# Patient Record
Sex: Female | Born: 1975 | Race: White | Hispanic: No | Marital: Married
Health system: Southern US, Community
[De-identification: ages and names within clinical notes are randomized; demographics above are authoritative.]

---

## 1998-10-16 ENCOUNTER — Ambulatory Visit (HOSPITAL_COMMUNITY): Admission: RE | Admit: 1998-10-16 | Discharge: 1998-10-16 | Payer: Self-pay

## 2007-09-10 ENCOUNTER — Other Ambulatory Visit: Payer: Self-pay

## 2007-09-10 ENCOUNTER — Emergency Department: Payer: Self-pay | Admitting: Internal Medicine

## 2007-10-14 ENCOUNTER — Ambulatory Visit: Payer: Self-pay | Admitting: Cardiology

## 2010-04-23 ENCOUNTER — Encounter: Payer: Self-pay | Admitting: Obstetrics and Gynecology

## 2010-05-29 ENCOUNTER — Observation Stay: Payer: Self-pay | Admitting: Obstetrics and Gynecology

## 2010-06-22 ENCOUNTER — Observation Stay: Payer: Self-pay | Admitting: Obstetrics and Gynecology

## 2010-06-29 ENCOUNTER — Observation Stay: Payer: Self-pay

## 2010-07-06 ENCOUNTER — Observation Stay: Payer: Self-pay

## 2010-07-13 ENCOUNTER — Observation Stay: Payer: Self-pay | Admitting: Obstetrics and Gynecology

## 2010-07-20 ENCOUNTER — Observation Stay: Payer: Self-pay | Admitting: Obstetrics and Gynecology

## 2010-07-27 ENCOUNTER — Observation Stay: Payer: Self-pay

## 2010-07-30 ENCOUNTER — Observation Stay: Payer: Self-pay | Admitting: Obstetrics and Gynecology

## 2010-08-08 ENCOUNTER — Observation Stay: Payer: Self-pay | Admitting: Obstetrics and Gynecology

## 2010-08-09 ENCOUNTER — Inpatient Hospital Stay: Payer: Self-pay | Admitting: Obstetrics and Gynecology

## 2010-11-26 ENCOUNTER — Ambulatory Visit: Payer: Self-pay | Admitting: Gastroenterology

## 2011-03-05 ENCOUNTER — Ambulatory Visit: Payer: Self-pay | Admitting: Anesthesiology

## 2011-03-05 LAB — POTASSIUM: Potassium: 4.4 mmol/L (ref 3.5–5.1)

## 2011-03-06 ENCOUNTER — Ambulatory Visit: Payer: Self-pay | Admitting: Surgery

## 2011-03-07 LAB — PATHOLOGY REPORT

## 2011-05-07 ENCOUNTER — Ambulatory Visit: Payer: Self-pay | Admitting: Obstetrics and Gynecology

## 2012-08-09 IMAGING — US ABDOMEN ULTRASOUND LIMITED
1 series · 17 of 25 positions shown · non-contrast
Comparison: none

REASON FOR EXAM: RUQ LUQ epigastric nausea
COMMENTS:

[Series 1: abdomen ultrasound limited · 17 of 93 slices shown]
[im 1/93]
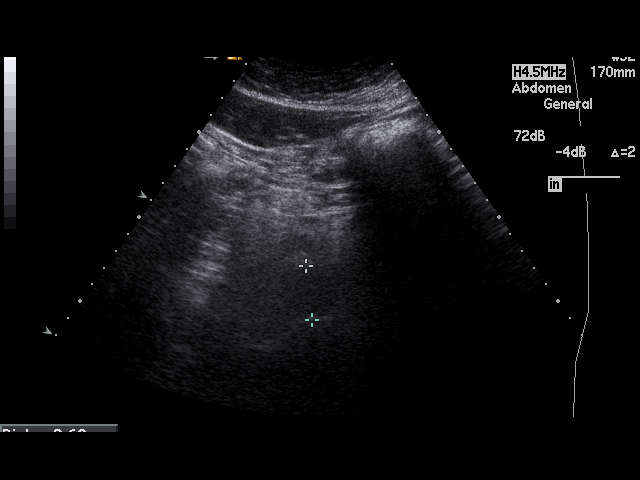
[im 8/93]
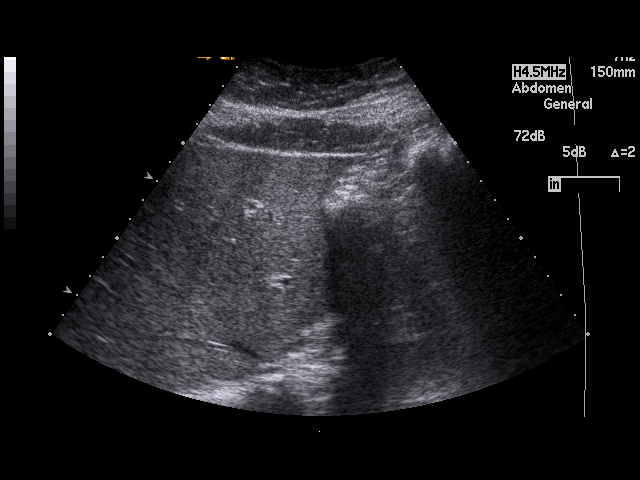
[im 12/93]
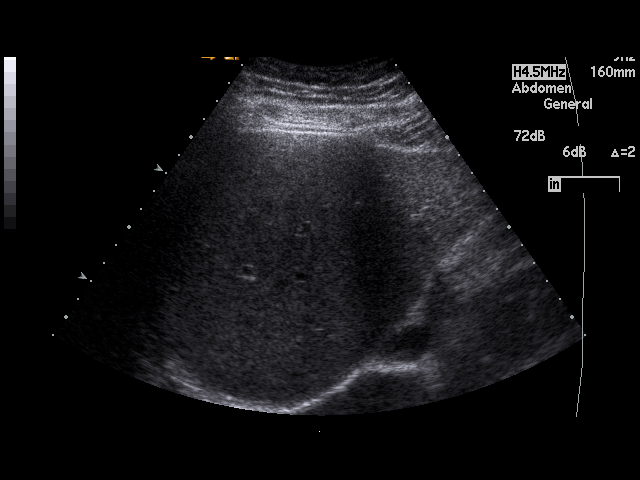
[im 20/93]
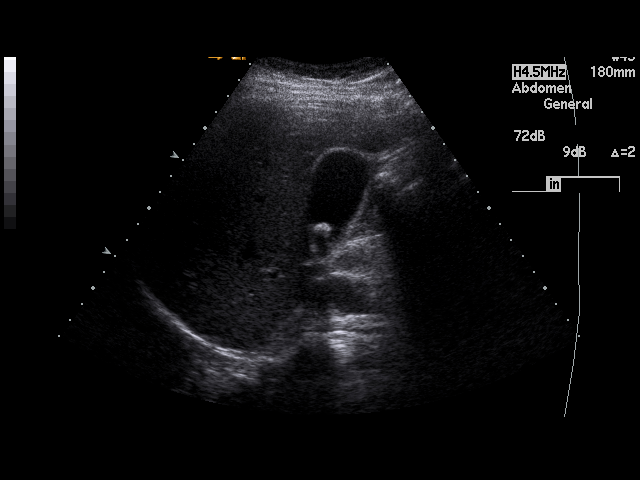
[im 24/93]
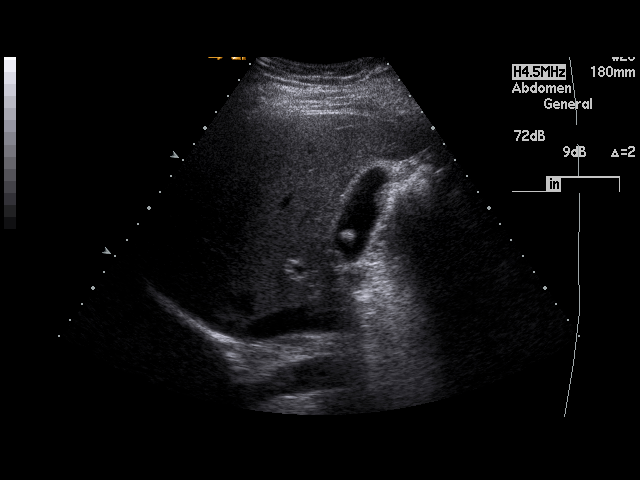
[im 31/93]
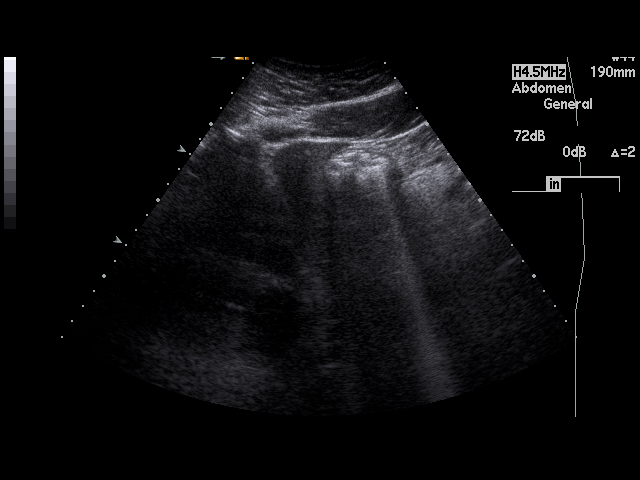
[im 35/93]
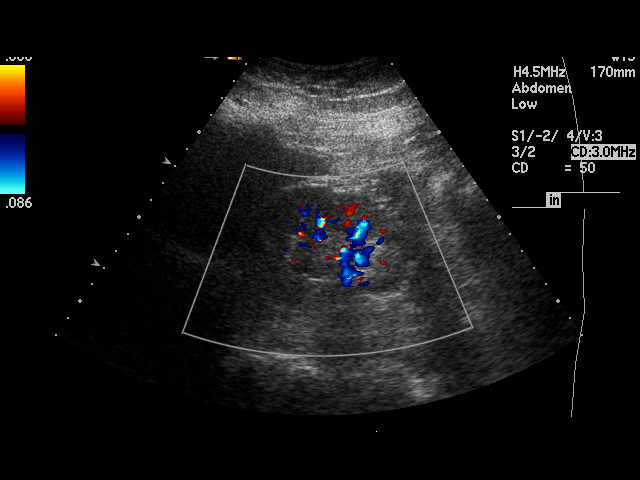
[im 43/93]
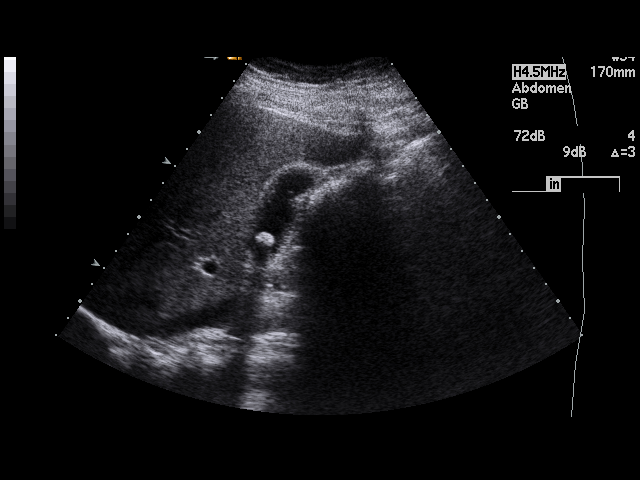
[im 47/93]
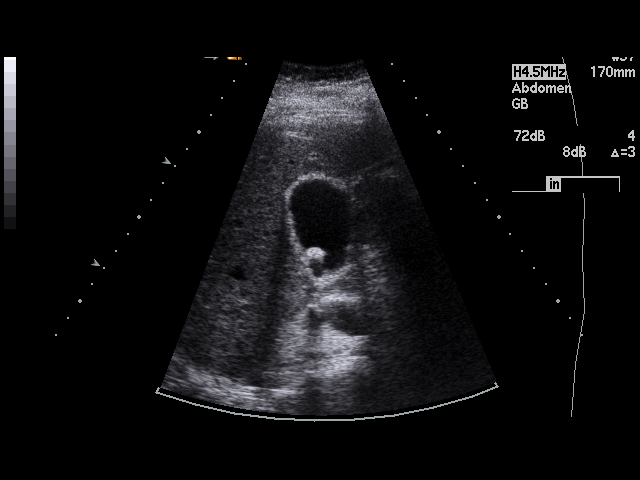
[im 50/93]
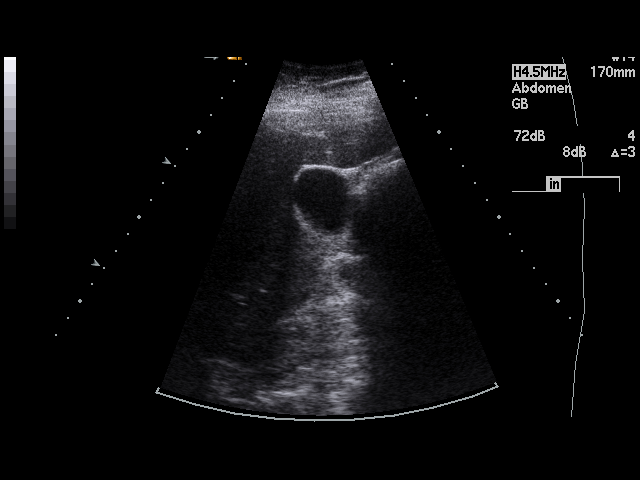
[im 58/93]
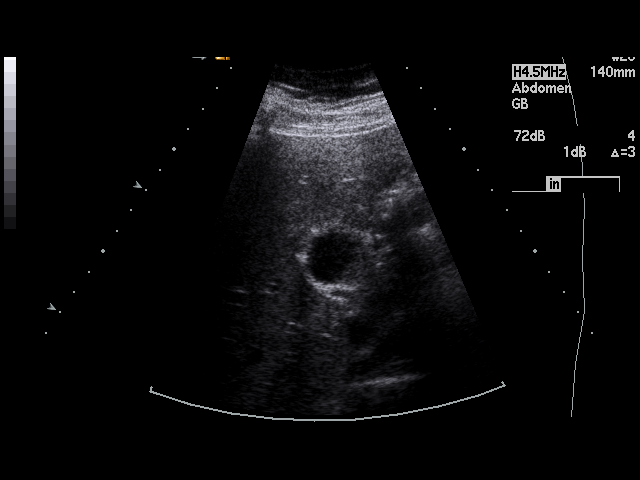
[im 62/93]
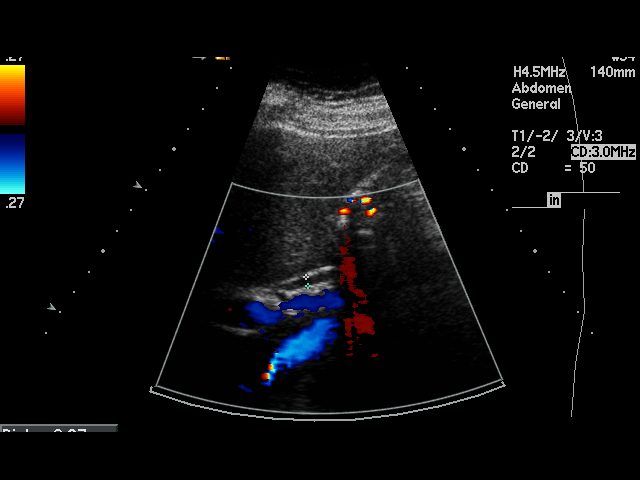
[im 70/93]
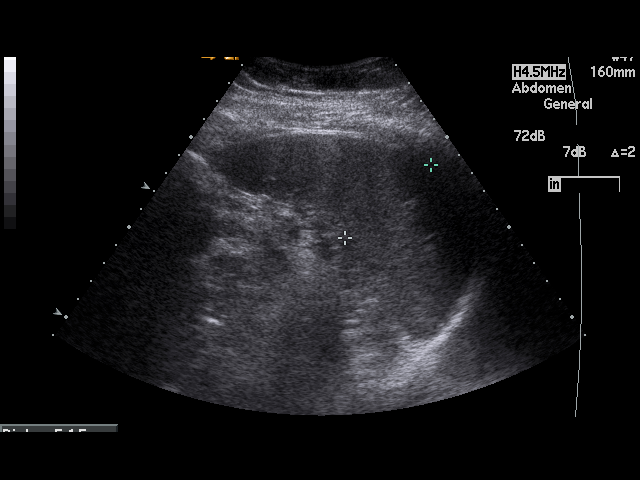
[im 73/93]
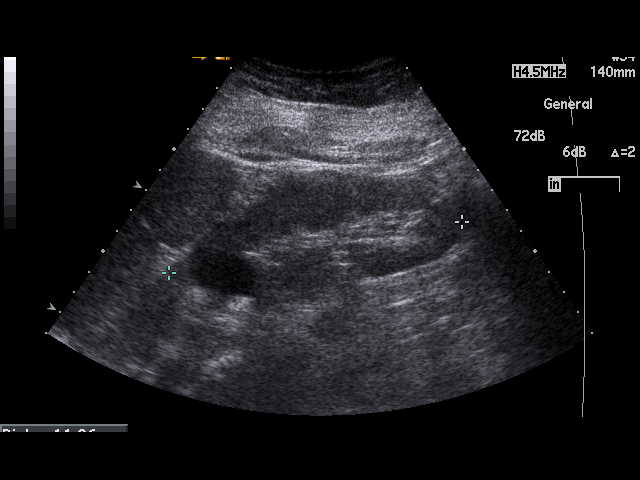
[im 81/93]
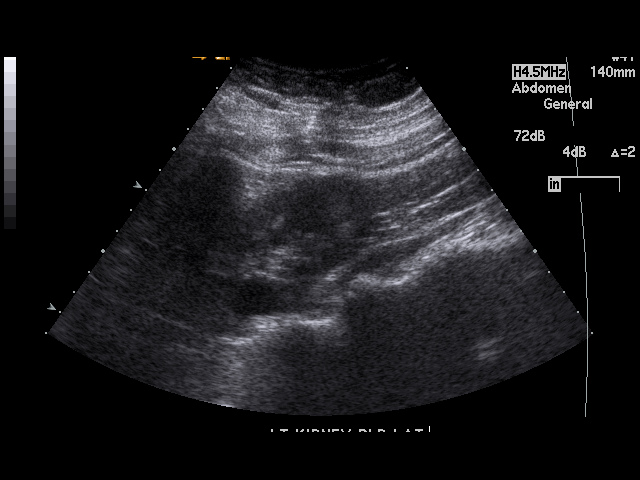
[im 85/93]
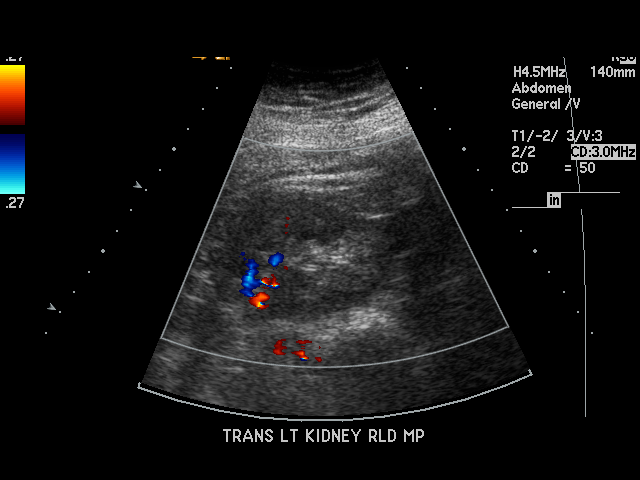
[im 93/93]
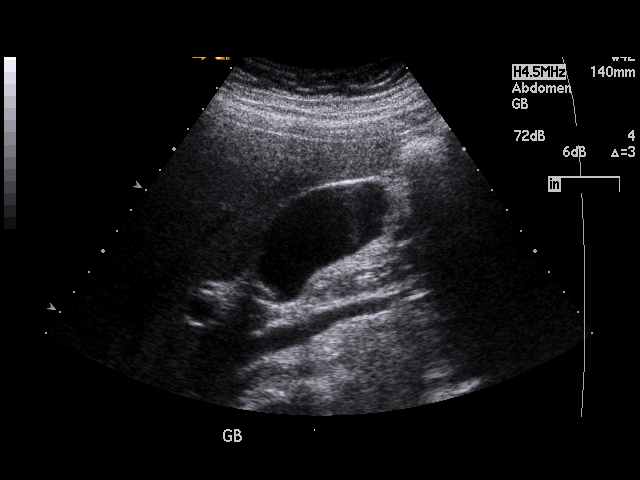

[17 of 25 positions shown; findings below may reference images not displayed]

PROCEDURE:     DRAPEL - DRAPEL ABDOMEN UPPER GENERAL  - November 26, 2010 [DATE]

RESULT:     The liver, spleen, abdominal aorta and inferior vena cava show
no significant abnormalities. The body of the pancreas is normal in
appearance. The head and tail are partially obscured by bowel gas and are
not visualized adequately for evaluation. There is observed a mobile
gallstone in the gallbladder. There is no thickening of the gallbladder
wall. The common bile duct measures 4.3 mm in diameter which is within
normal limits. The kidneys show no hydronephrosis. There is a 1.77 cm cyst
to the upper pole of the left kidney. Sagittally, the right kidney measures
12.29 cm and the left measures 12.64 cm. No ascites is seen.
IMPRESSION: 1. Cholelithiasis. There is no associated thickening of the gallbladder wall
or pericholecystic fluid.
2. A left renal cyst is noted as mentioned above.
3. The pancreas is partially obscured by bowel gas.

## 2014-06-19 NOTE — Op Note (Signed)
PATIENT NAME:  Christina Lester, Christina Lester MR#:  [id number] DATE OF BIRTH:  1975/11/01  DATE OF PROCEDURE:  03/06/2011  PREOPERATIVE DIAGNOSIS: Chronic cholecystitis, cholelithiasis.   POSTOPERATIVE DIAGNOSIS: Chronic cholecystitis, cholelithiasis.   PROCEDURE: Laparoscopic cholecystectomy.   SURGEON: Renda RollsWilton Smith, MD  ASSISTANT: Carmell AustriaAnn Collins, PA  ANESTHESIA: General.   INDICATIONS: This 39 year old female has a history of right upper quadrant abdominal pain and ultrasound findings of a solitary gallstone and surgery was recommended for definitive treatment.   DESCRIPTION OF PROCEDURE: The patient was placed on the operating room table in the supine position under general endotracheal anesthesia. The abdomen was prepared with ChloraPrep and draped in a sterile manner.   A short incision was made in the inferior aspect of the umbilicus and carried down to the deep fascia which was grasped with laryngeal hook and elevated. A Veress needle was inserted, aspirated, and irrigated with a saline solution. Next, the peritoneal cavity was inflated with carbon dioxide. The Veress needle was removed. The 10 mm cannula was inserted. The 10 mm, 0 degree laparoscope was inserted to view the peritoneal cavity. Another incision was made in the epigastrium just slightly to the right of the midline to introduce an 11 mm cannula. Two incisions were made in the lateral aspect of the right upper quadrant to introduce two 5 mm cannulas.   Initial inspection revealed the liver had a normal appearance. The gallbladder had a slightly thickened wall and phrygian cap. The gallbladder was retracted towards the right shoulder. The infundibulum was retracted inferiorly and laterally. The porta hepatis was demonstrated. The cystic duct was dissected free from surrounding structures. The neck of the gallbladder was mobilized with incision of the visceral peritoneum, the cystic artery was dissected free from surrounding structures, and the  porta hepatis was further demonstrated. A critical view of safety was demonstrated. An endoclip was placed across the cystic duct adjacent to the neck of the gallbladder. An incision was made in the cystic duct. An effort was made to insert a Reddick catheter, however, it would not thread. Therefore, a cholangiogram was not done. The Reddick catheter was removed. The cystic duct was doubly ligated with endoclips and divided. The cystic artery was controlled with double endoclips and divided. The gallbladder was dissected free from the liver with hook and cautery. There was some drainage of a thin bile which was aspirated. The gallbladder was completely separated. The site was irrigated with heparinized saline solution and aspirated. Hemostasis was subsequently intact. The gallbladder was delivered up through the infraumbilical incision, opened and suctioned. A stone was crushed and removed. The gallbladder was removed and sent with the stone in formalin for routine pathology. The right upper quadrant was further inspected, irrigated, and aspirated. Hemostasis was intact. The cannulas were removed allowing carbon dioxide to escape from the peritoneal cavity. Skin incisions were closed with interrupted 5-0 chromic subcuticular sutures, benzoin, and Steri-Strips. Dressings were applied with paper tape. The patient is now being prepared for transfer to the Recovery Room.  ____________________________ Shela CommonsJ. Renda RollsWilton Smith, MD [email or web address] D: 03/06/2011 11:12:13 ET T: 03/06/2011 12:12:47 ET JOB#: [id number]  cc: Adella HareJ. Wilton Smith, MD, <Dictator> Adella HareWILTON J SMITH MD ELECTRONICALLY SIGNED 03/31/2011 15:03
